# Patient Record
Sex: Female | Born: 1990 | Race: White | Hispanic: No | Smoking: Never smoker
Health system: Southern US, Community
[De-identification: ages and names within clinical notes are randomized; demographics above are authoritative.]

---

## 2013-09-16 ENCOUNTER — Inpatient Hospital Stay (HOSPITAL_COMMUNITY)
Admission: EM | Admit: 2013-09-16 | Discharge: 2013-09-20 | DRG: 956 | Disposition: A | Discharge status: Home Health | Payer: BC Managed Care – PPO | Attending: General Surgery | Admitting: General Surgery

## 2013-09-16 ENCOUNTER — Emergency Department (HOSPITAL_COMMUNITY): Payer: BC Managed Care – PPO

## 2013-09-16 ENCOUNTER — Encounter (HOSPITAL_COMMUNITY): Payer: Self-pay | Admitting: Emergency Medicine

## 2013-09-16 ENCOUNTER — Encounter (HOSPITAL_COMMUNITY): Payer: BC Managed Care – PPO | Admitting: Anesthesiology

## 2013-09-16 ENCOUNTER — Encounter (HOSPITAL_COMMUNITY): Admission: EM | Disposition: A | Discharge status: Home Health | Payer: Self-pay | Source: Home / Self Care

## 2013-09-16 ENCOUNTER — Emergency Department (HOSPITAL_COMMUNITY): Payer: BC Managed Care – PPO | Admitting: Anesthesiology

## 2013-09-16 DIAGNOSIS — S7292XA Unspecified fracture of left femur, initial encounter for closed fracture: Secondary | ICD-10-CM

## 2013-09-16 DIAGNOSIS — S27899A Unspecified injury of other specified intrathoracic organs, initial encounter: Secondary | ICD-10-CM | POA: Diagnosis present

## 2013-09-16 DIAGNOSIS — S27329A Contusion of lung, unspecified, initial encounter: Secondary | ICD-10-CM | POA: Diagnosis present

## 2013-09-16 DIAGNOSIS — S060XAA Concussion with loss of consciousness status unknown, initial encounter: Secondary | ICD-10-CM | POA: Diagnosis present

## 2013-09-16 DIAGNOSIS — M79609 Pain in unspecified limb: Secondary | ICD-10-CM | POA: Diagnosis present

## 2013-09-16 DIAGNOSIS — S72309A Unspecified fracture of shaft of unspecified femur, initial encounter for closed fracture: Principal | ICD-10-CM | POA: Diagnosis present

## 2013-09-16 DIAGNOSIS — IMO0002 Reserved for concepts with insufficient information to code with codable children: Secondary | ICD-10-CM | POA: Diagnosis present

## 2013-09-16 DIAGNOSIS — S060X9A Concussion with loss of consciousness of unspecified duration, initial encounter: Secondary | ICD-10-CM | POA: Diagnosis present

## 2013-09-16 DIAGNOSIS — T07XXXA Unspecified multiple injuries, initial encounter: Secondary | ICD-10-CM | POA: Diagnosis present

## 2013-09-16 DIAGNOSIS — D62 Acute posthemorrhagic anemia: Secondary | ICD-10-CM | POA: Diagnosis not present

## 2013-09-16 DIAGNOSIS — S0181XA Laceration without foreign body of other part of head, initial encounter: Secondary | ICD-10-CM

## 2013-09-16 DIAGNOSIS — Y92410 Unspecified street and highway as the place of occurrence of the external cause: Secondary | ICD-10-CM

## 2013-09-16 DIAGNOSIS — S27892A Contusion of other specified intrathoracic organs, initial encounter: Secondary | ICD-10-CM

## 2013-09-16 DIAGNOSIS — S27322A Contusion of lung, bilateral, initial encounter: Secondary | ICD-10-CM

## 2013-09-16 HISTORY — PX: FEMUR IM NAIL: SHX1597

## 2013-09-16 LAB — SAMPLE TO BLOOD BANK

## 2013-09-16 LAB — URINE MICROSCOPIC-ADD ON

## 2013-09-16 LAB — CBC
HEMATOCRIT: 37.3 % (ref 36.0–46.0)
HEMOGLOBIN: 12.6 g/dL (ref 12.0–15.0)
MCH: 28.3 pg (ref 26.0–34.0)
MCHC: 33.8 g/dL (ref 30.0–36.0)
MCV: 83.8 fL (ref 78.0–100.0)
Platelets: 278 10*3/uL (ref 150–400)
RBC: 4.45 MIL/uL (ref 3.87–5.11)
RDW: 12.5 % (ref 11.5–15.5)
WBC: 13.6 10*3/uL — ABNORMAL HIGH (ref 4.0–10.5)

## 2013-09-16 LAB — COMPREHENSIVE METABOLIC PANEL
ALT: 162 U/L — ABNORMAL HIGH (ref 0–35)
AST: 242 U/L — AB (ref 0–37)
Albumin: 4.1 g/dL (ref 3.5–5.2)
Alkaline Phosphatase: 40 U/L (ref 39–117)
BUN: 10 mg/dL (ref 6–23)
CALCIUM: 8.5 mg/dL (ref 8.4–10.5)
CO2: 21 mEq/L (ref 19–32)
Chloride: 102 mEq/L (ref 96–112)
Creatinine, Ser: 0.7 mg/dL (ref 0.50–1.10)
GFR calc non Af Amer: >90 mL/min (ref >90)
GLUCOSE: 128 mg/dL — AB (ref 70–99)
Potassium: 3.3 mEq/L — ABNORMAL LOW (ref 3.7–5.3)
Sodium: 141 mEq/L (ref 137–147)
TOTAL PROTEIN: 6.8 g/dL (ref 6.0–8.3)
Total Bilirubin: 0.3 mg/dL (ref 0.3–1.2)

## 2013-09-16 LAB — PROTIME-INR
INR: 1.08 (ref 0.00–1.49)
PROTHROMBIN TIME: 14 s (ref 11.6–15.2)

## 2013-09-16 LAB — ETHANOL: Alcohol, Ethyl (B): <11 mg/dL (ref 0–11)

## 2013-09-16 LAB — URINALYSIS, ROUTINE W REFLEX MICROSCOPIC
Bilirubin Urine: NEGATIVE
Glucose, UA: NEGATIVE mg/dL
Ketones, ur: NEGATIVE mg/dL
LEUKOCYTES UA: NEGATIVE
NITRITE: NEGATIVE
Protein, ur: NEGATIVE mg/dL
SPECIFIC GRAVITY, URINE: 1.005 (ref 1.005–1.030)
Urobilinogen, UA: 0.2 mg/dL (ref 0.0–1.0)
pH: 5 (ref 5.0–8.0)

## 2013-09-16 LAB — HCG, SERUM, QUALITATIVE: Preg, Serum: NEGATIVE

## 2013-09-16 LAB — PREGNANCY, URINE: Preg Test, Ur: NEGATIVE

## 2013-09-16 LAB — CDS SEROLOGY

## 2013-09-16 SURGERY — INSERTION, INTRAMEDULLARY ROD, FEMUR
Anesthesia: General | Site: Leg Upper | Laterality: Left

## 2013-09-16 MED ORDER — SUCCINYLCHOLINE CHLORIDE 20 MG/ML IJ SOLN
INTRAMUSCULAR | Status: AC
  Filled 2013-09-16: qty 1

## 2013-09-16 MED ORDER — IOHEXOL 350 MG/ML SOLN
100.0000 mL | Freq: Once | INTRAVENOUS | Status: AC | PRN
  Administered 2013-09-16: 100 mL via INTRAVENOUS

## 2013-09-16 MED ORDER — SUCCINYLCHOLINE CHLORIDE 20 MG/ML IJ SOLN
INTRAMUSCULAR | Status: DC | PRN
  Administered 2013-09-16: 130 mg via INTRAVENOUS

## 2013-09-16 MED ORDER — MORPHINE SULFATE 4 MG/ML IJ SOLN: 4.0000 mg | Freq: Once | INTRAMUSCULAR | Status: AC

## 2013-09-16 MED ORDER — GLYCOPYRROLATE 0.2 MG/ML IJ SOLN
INTRAMUSCULAR | Status: AC
  Filled 2013-09-16: qty 3

## 2013-09-16 MED ORDER — ROCURONIUM BROMIDE 50 MG/5ML IV SOLN
INTRAVENOUS | Status: AC
  Filled 2013-09-16: qty 1

## 2013-09-16 MED ORDER — ARTIFICIAL TEARS OP OINT
TOPICAL_OINTMENT | OPHTHALMIC | Status: DC | PRN
  Administered 2013-09-16: 1 via OPHTHALMIC

## 2013-09-16 MED ORDER — MIDAZOLAM HCL 2 MG/2ML IJ SOLN
INTRAMUSCULAR | Status: AC
  Filled 2013-09-16: qty 2

## 2013-09-16 MED ORDER — MORPHINE SULFATE 2 MG/ML IJ SOLN
INTRAMUSCULAR | Status: AC
  Administered 2013-09-16: 4 mg via INTRAVENOUS
  Filled 2013-09-16: qty 2

## 2013-09-16 MED ORDER — CEFAZOLIN SODIUM-DEXTROSE 2-3 GM-% IV SOLR
2.0000 g | INTRAVENOUS | Status: AC
Start: 2013-09-16 — End: 2013-09-16
  Administered 2013-09-16: 2 g via INTRAVENOUS

## 2013-09-16 MED ORDER — PROPOFOL 10 MG/ML IV BOLUS
INTRAVENOUS | Status: AC
  Filled 2013-09-16: qty 20

## 2013-09-16 MED ORDER — PROPOFOL 10 MG/ML IV BOLUS
INTRAVENOUS | Status: DC | PRN
  Administered 2013-09-16: 120 mg via INTRAVENOUS

## 2013-09-16 MED ORDER — NEOSTIGMINE METHYLSULFATE 10 MG/10ML IV SOLN
INTRAVENOUS | Status: AC
  Filled 2013-09-16: qty 1

## 2013-09-16 MED ORDER — LIDOCAINE HCL (CARDIAC) 20 MG/ML IV SOLN
INTRAVENOUS | Status: AC
  Filled 2013-09-16: qty 5

## 2013-09-16 MED ORDER — FENTANYL CITRATE 0.05 MG/ML IJ SOLN
INTRAMUSCULAR | Status: DC | PRN
Start: 2013-09-16 — End: 2013-09-17
  Administered 2013-09-16: 50 ug via INTRAVENOUS
  Administered 2013-09-16: 200 ug via INTRAVENOUS
  Administered 2013-09-17: 50 ug via INTRAVENOUS

## 2013-09-16 MED ORDER — ONDANSETRON HCL 4 MG/2ML IJ SOLN
INTRAMUSCULAR | Status: AC
  Filled 2013-09-16: qty 2

## 2013-09-16 MED ORDER — LIDOCAINE HCL (CARDIAC) 20 MG/ML IV SOLN
INTRAVENOUS | Status: DC | PRN
  Administered 2013-09-16: 30 mg via INTRAVENOUS

## 2013-09-16 MED ORDER — MIDAZOLAM HCL 5 MG/5ML IJ SOLN
INTRAMUSCULAR | Status: DC | PRN
Start: 2013-09-16 — End: 2013-09-17
  Administered 2013-09-16: 1 mg via INTRAVENOUS

## 2013-09-16 MED ORDER — FENTANYL CITRATE 0.05 MG/ML IJ SOLN
INTRAMUSCULAR | Status: AC
  Filled 2013-09-16: qty 5

## 2013-09-16 SURGICAL SUPPLY — 53 items
BIT DRILL LONG 4.0MM (BIT) ×2 IMPLANT
BIT DRILL SHORT 4.0 (BIT) ×1 IMPLANT
BNDG COHESIVE 4X5 TAN NS LF (GAUZE/BANDAGES/DRESSINGS) ×3 IMPLANT
CONT SPEC 4OZ CLIKSEAL STRL BL (MISCELLANEOUS) ×3 IMPLANT
COVER PERINEAL POST (MISCELLANEOUS) ×3 IMPLANT
COVER SURGICAL LIGHT HANDLE (MISCELLANEOUS) ×3 IMPLANT
DRAPE INCISE IOBAN 66X45 STRL (DRAPES) ×3 IMPLANT
DRAPE STERI IOBAN 125X83 (DRAPES) ×3 IMPLANT
DRILL BIT LONG 4.0MM (BIT) ×6
DRILL BIT SHORT 4.0 (BIT) ×2
DRSG MEPILEX BORDER 4X4 (GAUZE/BANDAGES/DRESSINGS) ×3 IMPLANT
DRSG MEPILEX BORDER 4X8 (GAUZE/BANDAGES/DRESSINGS) ×3 IMPLANT
DRSG PAD ABDOMINAL 8X10 ST (GAUZE/BANDAGES/DRESSINGS) ×6 IMPLANT
DRSG TEGADERM 4X4.75 (GAUZE/BANDAGES/DRESSINGS) ×3 IMPLANT
DURAPREP 26ML APPLICATOR (WOUND CARE) ×3 IMPLANT
ELECT REM PT RETURN 9FT ADLT (ELECTROSURGICAL) ×3
ELECTRODE REM PT RTRN 9FT ADLT (ELECTROSURGICAL) ×1 IMPLANT
FACESHIELD WRAPAROUND (MASK) ×3 IMPLANT
GAUZE XEROFORM 5X9 LF (GAUZE/BANDAGES/DRESSINGS) ×3 IMPLANT
GLOVE BIO SURGEON STRL SZ8 (GLOVE) ×3 IMPLANT
GLOVE BIOGEL PI IND STRL 8 (GLOVE) ×2 IMPLANT
GLOVE BIOGEL PI INDICATOR 8 (GLOVE) ×4
GLOVE ORTHO TXT STRL SZ7.5 (GLOVE) ×3 IMPLANT
GOWN STRL REUS W/ TWL LRG LVL3 (GOWN DISPOSABLE) ×1 IMPLANT
GOWN STRL REUS W/ TWL XL LVL3 (GOWN DISPOSABLE) ×2 IMPLANT
GOWN STRL REUS W/TWL LRG LVL3 (GOWN DISPOSABLE) ×2
GOWN STRL REUS W/TWL XL LVL3 (GOWN DISPOSABLE) ×4
GUIDE PIN 3.2MM (MISCELLANEOUS) ×2
GUIDE PIN ORTH 343X3.2XBRAD (MISCELLANEOUS) ×1 IMPLANT
GUIDE ROD 3.0 (MISCELLANEOUS) ×3
KIT BASIN OR (CUSTOM PROCEDURE TRAY) ×3 IMPLANT
KIT ROOM TURNOVER OR (KITS) ×3 IMPLANT
LINER BOOT UNIVERSAL DISP (MISCELLANEOUS) IMPLANT
MANIFOLD NEPTUNE II (INSTRUMENTS) IMPLANT
NAIL TAN LT LIME 10X36 (Nail) ×3 IMPLANT
NS IRRIG 1000ML POUR BTL (IV SOLUTION) ×3 IMPLANT
PACK GENERAL/GYN (CUSTOM PROCEDURE TRAY) ×3 IMPLANT
PAD ARMBOARD 7.5X6 YLW CONV (MISCELLANEOUS) ×3 IMPLANT
PAD CAST 4YDX4 CTTN HI CHSV (CAST SUPPLIES) ×2 IMPLANT
PADDING CAST COTTON 4X4 STRL (CAST SUPPLIES) ×4
ROD GUIDE 3.0 (MISCELLANEOUS) ×1 IMPLANT
SCREW TRIGEN LOW PROF 5.0X37.5 (Screw) ×3 IMPLANT
SCREW TRIGEN LOW PROF 5.0X65 (Screw) ×3 IMPLANT
SPONGE GAUZE 4X4 12PLY STER LF (GAUZE/BANDAGES/DRESSINGS) ×3 IMPLANT
STAPLER VISISTAT 35W (STAPLE) ×3 IMPLANT
SUT ETHILON 4 0 PS 2 18 (SUTURE) ×3 IMPLANT
SUT VIC AB 0 CT1 27 (SUTURE) ×2
SUT VIC AB 0 CT1 27XBRD ANBCTR (SUTURE) ×1 IMPLANT
SUT VIC AB 2-0 CT1 27 (SUTURE) ×2
SUT VIC AB 2-0 CT1 TAPERPNT 27 (SUTURE) ×1 IMPLANT
TOWEL OR 17X24 6PK STRL BLUE (TOWEL DISPOSABLE) ×3 IMPLANT
TOWEL OR 17X26 10 PK STRL BLUE (TOWEL DISPOSABLE) ×3 IMPLANT
WATER STERILE IRR 1000ML POUR (IV SOLUTION) IMPLANT
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
## 2013-09-16 NOTE — Anesthesia Preprocedure Evaluation (Addendum)
Anesthesia Evaluation  Patient identified by MRN, date of birth, ID band Patient awake    Reviewed: Allergy & Precautions, H&P , NPO status , Patient's Chart, lab work & pertinent test results  History of Anesthesia Complications Negative for: history of anesthetic complications  Airway Mallampati: I TM Distance: >3 FB Neck ROM: Full    Dental  (+) Teeth Intact, Dental Advisory Given   Pulmonary  Small pulmonary contusion Small mediastinal hematoma breath sounds clear to auscultation  Pulmonary exam normal       Cardiovascular negative cardio ROS  Rhythm:Regular Rate:Normal     Neuro/Psych  C-spine not cleared negative neurological ROS     GI/Hepatic negative GI ROS, Elevated LFTs: abd scan clear, trauma MD to follow   Endo/Other  negative endocrine ROS  Renal/GU negative Renal ROS     Musculoskeletal   Abdominal   Peds  Hematology negative hematology ROS (+)   Anesthesia Other Findings   Reproductive/Obstetrics 09/16/13 preg test: NEG                          Anesthesia Physical Anesthesia Plan  ASA: II and emergent  Anesthesia Plan: General   Post-op Pain Management:    Induction: Intravenous and Rapid sequence  Airway Management Planned: Oral ETT  Additional Equipment:   Intra-op Plan:   Post-operative Plan: Extubation in OR  Informed Consent: I have reviewed the patients History and Physical, chart, labs and discussed the procedure including the risks, benefits and alternatives for the proposed anesthesia with the patient or authorized representative who has indicated his/her understanding and acceptance.   Dental advisory given  Plan Discussed with: CRNA and Surgeon  Anesthesia Plan Comments: (Plan routine monitors, GETA)        Anesthesia Quick Evaluation
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
## 2013-09-17 ENCOUNTER — Emergency Department (HOSPITAL_COMMUNITY): Payer: BC Managed Care – PPO

## 2013-09-17 ENCOUNTER — Encounter (HOSPITAL_COMMUNITY): Payer: Self-pay | Admitting: General Surgery

## 2013-09-17 DIAGNOSIS — S27329A Contusion of lung, unspecified, initial encounter: Secondary | ICD-10-CM

## 2013-09-17 DIAGNOSIS — S7290XA Unspecified fracture of unspecified femur, initial encounter for closed fracture: Secondary | ICD-10-CM

## 2013-09-17 LAB — COMPREHENSIVE METABOLIC PANEL
ALBUMIN: 3.1 g/dL — AB (ref 3.5–5.2)
ALK PHOS: 25 U/L — AB (ref 39–117)
ALT: 125 U/L — AB (ref 0–35)
AST: 166 U/L — AB (ref 0–37)
BUN: 8 mg/dL (ref 6–23)
CALCIUM: 7.8 mg/dL — AB (ref 8.4–10.5)
CO2: 17 mEq/L — ABNORMAL LOW (ref 19–32)
Chloride: 104 mEq/L (ref 96–112)
Creatinine, Ser: 0.53 mg/dL (ref 0.50–1.10)
GFR calc Af Amer: >90 mL/min (ref >90)
GFR calc non Af Amer: >90 mL/min (ref >90)
GLUCOSE: 143 mg/dL — AB (ref 70–99)
POTASSIUM: 3.9 meq/L (ref 3.7–5.3)
SODIUM: 137 meq/L (ref 137–147)
Total Bilirubin: 0.4 mg/dL (ref 0.3–1.2)
Total Protein: 5.3 g/dL — ABNORMAL LOW (ref 6.0–8.3)

## 2013-09-17 LAB — CBC
HEMATOCRIT: 27.6 % — AB (ref 36.0–46.0)
HEMOGLOBIN: 9.7 g/dL — AB (ref 12.0–15.0)
MCH: 29.5 pg (ref 26.0–34.0)
MCHC: 35.1 g/dL (ref 30.0–36.0)
MCV: 83.9 fL (ref 78.0–100.0)
Platelets: 149 10*3/uL — ABNORMAL LOW (ref 150–400)
RBC: 3.29 MIL/uL — ABNORMAL LOW (ref 3.87–5.11)
RDW: 12.4 % (ref 11.5–15.5)
WBC: 10.8 10*3/uL — ABNORMAL HIGH (ref 4.0–10.5)

## 2013-09-17 MED ORDER — MEPERIDINE HCL 25 MG/ML IJ SOLN
6.2500 mg | INTRAMUSCULAR | Status: DC | PRN
  Administered 2013-09-17: 12.5 mg via INTRAVENOUS

## 2013-09-17 MED ORDER — HYDROCODONE-ACETAMINOPHEN 5-325 MG PO TABS
1.0000 | ORAL_TABLET | ORAL | Status: DC | PRN
  Administered 2013-09-17 (×2): 2 via ORAL
  Filled 2013-09-17 (×2): qty 2

## 2013-09-17 MED ORDER — NEOSTIGMINE METHYLSULFATE 10 MG/10ML IV SOLN
INTRAVENOUS | Status: DC | PRN
  Administered 2013-09-17: 4 mg via INTRAVENOUS

## 2013-09-17 MED ORDER — MEPERIDINE HCL 25 MG/ML IJ SOLN
INTRAMUSCULAR | Status: AC
  Filled 2013-09-17: qty 1

## 2013-09-17 MED ORDER — FENTANYL CITRATE 0.05 MG/ML IJ SOLN
INTRAMUSCULAR | Status: AC
  Filled 2013-09-17: qty 5

## 2013-09-17 MED ORDER — OXYCODONE HCL 5 MG PO TABS
5.0000 mg | ORAL_TABLET | ORAL | Status: DC | PRN
  Administered 2013-09-18: 10 mg via ORAL
  Filled 2013-09-17: qty 2

## 2013-09-17 MED ORDER — ONDANSETRON HCL 4 MG/2ML IJ SOLN
INTRAMUSCULAR | Status: DC | PRN
  Administered 2013-09-17: 4 mg via INTRAVENOUS

## 2013-09-17 MED ORDER — GLYCOPYRROLATE 0.2 MG/ML IJ SOLN
INTRAMUSCULAR | Status: DC | PRN
  Administered 2013-09-17: .6 mg via INTRAVENOUS

## 2013-09-17 MED ORDER — METHOCARBAMOL 1000 MG/10ML IJ SOLN: 500.0000 mg | Freq: Four times a day (QID) | INTRAVENOUS | Status: DC | PRN

## 2013-09-17 MED ORDER — HYDROMORPHONE HCL PF 1 MG/ML IJ SOLN
INTRAMUSCULAR | Status: AC
  Filled 2013-09-17: qty 1

## 2013-09-17 MED ORDER — ONDANSETRON HCL 4 MG/2ML IJ SOLN
4.0000 mg | Freq: Four times a day (QID) | INTRAMUSCULAR | Status: DC | PRN
  Administered 2013-09-17 – 2013-09-18 (×2): 4 mg via INTRAVENOUS
  Filled 2013-09-17 (×2): qty 2

## 2013-09-17 MED ORDER — MIDAZOLAM HCL 2 MG/2ML IJ SOLN: 0.5000 mg | Freq: Once | INTRAMUSCULAR | Status: DC | PRN

## 2013-09-17 MED ORDER — METHOCARBAMOL 500 MG PO TABS
500.0000 mg | ORAL_TABLET | Freq: Four times a day (QID) | ORAL | Status: DC | PRN
  Administered 2013-09-17 – 2013-09-18 (×2): 500 mg via ORAL
  Filled 2013-09-17 (×2): qty 1

## 2013-09-17 MED ORDER — METOCLOPRAMIDE HCL 5 MG/ML IJ SOLN: 5.0000 mg | Freq: Three times a day (TID) | INTRAMUSCULAR | Status: DC | PRN

## 2013-09-17 MED ORDER — ENOXAPARIN SODIUM 40 MG/0.4ML ~~LOC~~ SOLN
40.0000 mg | SUBCUTANEOUS | Status: DC
  Filled 2013-09-17: qty 0.4

## 2013-09-17 MED ORDER — DOCUSATE SODIUM 100 MG PO CAPS
100.0000 mg | ORAL_CAPSULE | Freq: Two times a day (BID) | ORAL | Status: DC
  Administered 2013-09-17 – 2013-09-20 (×7): 100 mg via ORAL
  Filled 2013-09-17 (×12): qty 1

## 2013-09-17 MED ORDER — PANTOPRAZOLE SODIUM 40 MG PO TBEC
40.0000 mg | DELAYED_RELEASE_TABLET | Freq: Every day | ORAL | Status: DC
  Administered 2013-09-17 – 2013-09-18 (×2): 40 mg via ORAL
  Filled 2013-09-17 (×2): qty 1

## 2013-09-17 MED ORDER — DIPHENHYDRAMINE HCL 12.5 MG/5ML PO ELIX: 12.5000 mg | ORAL_SOLUTION | ORAL | Status: DC | PRN

## 2013-09-17 MED ORDER — 0.9 % SODIUM CHLORIDE (POUR BTL) OPTIME
TOPICAL | Status: DC | PRN
  Administered 2013-09-16: 1000 mL

## 2013-09-17 MED ORDER — OXYCODONE HCL 5 MG PO TABS: 5.0000 mg | ORAL_TABLET | Freq: Once | ORAL | Status: DC | PRN

## 2013-09-17 MED ORDER — CEFAZOLIN SODIUM 1-5 GM-% IV SOLN
1.0000 g | Freq: Four times a day (QID) | INTRAVENOUS | Status: AC
  Administered 2013-09-17 (×3): 1 g via INTRAVENOUS
  Filled 2013-09-17 (×4): qty 50

## 2013-09-17 MED ORDER — ONDANSETRON HCL 4 MG PO TABS: 4.0000 mg | ORAL_TABLET | Freq: Four times a day (QID) | ORAL | Status: DC | PRN

## 2013-09-17 MED ORDER — HYDROMORPHONE HCL PF 1 MG/ML IJ SOLN
0.2500 mg | INTRAMUSCULAR | Status: DC | PRN
  Administered 2013-09-17 (×4): 0.5 mg via INTRAVENOUS

## 2013-09-17 MED ORDER — LACTATED RINGERS IV SOLN
INTRAVENOUS | Status: DC | PRN
  Administered 2013-09-16 – 2013-09-17 (×2): via INTRAVENOUS

## 2013-09-17 MED ORDER — PROMETHAZINE HCL 25 MG/ML IJ SOLN: 6.2500 mg | INTRAMUSCULAR | Status: DC | PRN

## 2013-09-17 MED ORDER — METOCLOPRAMIDE HCL 5 MG PO TABS
5.0000 mg | ORAL_TABLET | Freq: Three times a day (TID) | ORAL | Status: DC | PRN
Start: 2013-09-17 — End: 2013-09-18
  Filled 2013-09-17: qty 2

## 2013-09-17 MED ORDER — MORPHINE SULFATE 2 MG/ML IJ SOLN
1.0000 mg | INTRAMUSCULAR | Status: DC | PRN
  Administered 2013-09-17 (×5): 2 mg via INTRAVENOUS
  Administered 2013-09-17 – 2013-09-18 (×2): 4 mg via INTRAVENOUS
  Filled 2013-09-17: qty 2
  Filled 2013-09-17 (×2): qty 1
  Filled 2013-09-17: qty 2
  Filled 2013-09-17 (×3): qty 1

## 2013-09-17 MED ORDER — SODIUM CHLORIDE 0.9 % IV SOLN
INTRAVENOUS | Status: DC
  Administered 2013-09-17 (×2): via INTRAVENOUS

## 2013-09-17 MED ORDER — OXYCODONE HCL 5 MG/5ML PO SOLN: 5.0000 mg | Freq: Once | ORAL | Status: DC | PRN

## 2013-09-17 MED ORDER — PHENYLEPHRINE HCL 10 MG/ML IJ SOLN
10.0000 mg | INTRAVENOUS | Status: DC | PRN
  Administered 2013-09-16: 10 ug/min via INTRAVENOUS

## 2013-09-17 MED ORDER — ROCURONIUM BROMIDE 100 MG/10ML IV SOLN
INTRAVENOUS | Status: DC | PRN
  Administered 2013-09-16: 30 mg via INTRAVENOUS
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
## 2013-09-17 NOTE — Addendum Note (Signed)
Addendum created 09/17/13 0349 by Edmonia CaprioAmanda M Auston, CRNA   Modules edited: Charges VN
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
## 2013-09-17 NOTE — Brief Op Note (Signed)
09/16/2013 - 09/17/2013  1:00 AM  PATIENT:  Katelyn Hess  22 y.o. female  PRE-OPERATIVE DIAGNOSIS:  left femur fracture  POST-OPERATIVE DIAGNOSIS:  left femur fracture  PROCEDURE:  Procedure(s): INTRAMEDULLARY (IM) NAIL FEMORAL (Left)  SURGEON:  Surgeon(s) and Role:    * Kathryne Hitchhristopher Y Blackman, MD - Primary  PHYSICIAN ASSISTANT: gil clark, PA-C  ANESTHESIA:   general  EBL:  Total I/O In: 1000 [I.V.:1000] Out: 1130 [Urine:930; Blood:200]  BLOOD ADMINISTERED:none  DRAINS: none   LOCAL MEDICATIONS USED:  NONE  SPECIMEN:  No Specimen  DISPOSITION OF SPECIMEN:  N/A  COUNTS:  YES  TOURNIQUET:  * No tourniquets in log *  DICTATION: .Other Dictation: Dictation Number [id number]  PLAN OF CARE: Admit to inpatient   PATIENT DISPOSITION:  PACU - hemodynamically stable.   Delay start of Pharmacological VTE agent (>24hrs) due to surgical blood loss or risk of bleeding: no

## 2013-09-17 NOTE — Addendum Note (Signed)
Addendum created 09/17/13 0228 by Edmonia CaprioAmanda M Auston, CRNA   Modules edited: Anesthesia Medication Administration
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
## 2013-09-17 NOTE — Anesthesia Postprocedure Evaluation (Signed)
  Anesthesia Post-op Note  Patient: Katelyn Hess  Procedure(s) Performed: Procedure(s): INTRAMEDULLARY (IM) NAIL FEMORAL (Left)  Patient Location: PACU  Anesthesia Type:General  Level of Consciousness: awake, alert , oriented and patient cooperative  Airway and Oxygen Therapy: Patient Spontanous Breathing and Patient connected to nasal cannula oxygen  Post-op Pain: 4 /10, mild  Post-op Assessment: Post-op Vital signs reviewed, Patient's Cardiovascular Status Stable, Respiratory Function Stable, Patent Airway, No signs of Nausea or vomiting and Pain level controlled  Post-op Vital Signs: Reviewed and stable  Last Vitals:  Filed Vitals:   09/17/13 0214  BP: 109/68  Pulse:   Temp:   Resp: 19    Complications: No apparent anesthesia complications

## 2013-09-17 NOTE — Progress Notes (Signed)
PT Cancellation Note  Patient Details Name: Katelyn Hess MRN: [id number] DOB: 11/04/1990   Cancelled Treatment:    Reason Eval/Treat Not Completed: Patient not medically ready  Orders for PT evaluation to start tomorrow (09/18/13). Will follow up with patient tomorrow.  [address]Logan Secor MindoroBarbour, [place] CarolinaPT [phone]  [place] Mount[place]bour, Logan S 09/17/2013, 8:35 AM

## 2013-09-17 NOTE — Transfer of Care (Signed)
Immediate Anesthesia Transfer of Care Note  Patient: Katelyn Hess  Procedure(s) Performed: Procedure(s): INTRAMEDULLARY (IM) NAIL FEMORAL (Left)  Patient Location: PACU  Anesthesia Type:General  Level of Consciousness: awake and alert   Airway & Oxygen Therapy: Patient Spontanous Breathing and Patient connected to nasal cannula oxygen  Post-op Assessment: Report given to PACU RN and Post -op Vital signs reviewed and stable  Post vital signs: Reviewed and stable  Complications: No apparent anesthesia complications

## 2013-09-18 ENCOUNTER — Inpatient Hospital Stay (HOSPITAL_COMMUNITY): Payer: BC Managed Care – PPO

## 2013-09-18 DIAGNOSIS — D62 Acute posthemorrhagic anemia: Secondary | ICD-10-CM

## 2013-09-18 DIAGNOSIS — S27322A Contusion of lung, bilateral, initial encounter: Secondary | ICD-10-CM

## 2013-09-18 DIAGNOSIS — S060X9A Concussion with loss of consciousness of unspecified duration, initial encounter: Secondary | ICD-10-CM

## 2013-09-18 DIAGNOSIS — S27892A Contusion of other specified intrathoracic organs, initial encounter: Secondary | ICD-10-CM

## 2013-09-18 DIAGNOSIS — S060XAA Concussion with loss of consciousness status unknown, initial encounter: Secondary | ICD-10-CM

## 2013-09-18 LAB — BASIC METABOLIC PANEL
BUN: 4 mg/dL — ABNORMAL LOW (ref 6–23)
CO2: 25 meq/L (ref 19–32)
CREATININE: 0.56 mg/dL (ref 0.50–1.10)
Calcium: 7.7 mg/dL — ABNORMAL LOW (ref 8.4–10.5)
Chloride: 107 mEq/L (ref 96–112)
GFR calc Af Amer: >90 mL/min (ref >90)
Glucose, Bld: 105 mg/dL — ABNORMAL HIGH (ref 70–99)
Potassium: 3.5 mEq/L — ABNORMAL LOW (ref 3.7–5.3)
Sodium: 142 mEq/L (ref 137–147)

## 2013-09-18 LAB — CBC
HCT: 22.2 % — ABNORMAL LOW (ref 36.0–46.0)
HCT: 22.4 % — ABNORMAL LOW (ref 36.0–46.0)
HEMOGLOBIN: 7.7 g/dL — AB (ref 12.0–15.0)
Hemoglobin: 7.7 g/dL — ABNORMAL LOW (ref 12.0–15.0)
MCH: 29.1 pg (ref 26.0–34.0)
MCH: 28.9 pg (ref 26.0–34.0)
MCHC: 34.7 g/dL (ref 30.0–36.0)
MCHC: 34.4 g/dL (ref 30.0–36.0)
MCV: 83.8 fL (ref 78.0–100.0)
MCV: 84.2 fL (ref 78.0–100.0)
Platelets: 119 10*3/uL — ABNORMAL LOW (ref 150–400)
Platelets: 162 10*3/uL (ref 150–400)
RBC: 2.65 MIL/uL — AB (ref 3.87–5.11)
RBC: 2.66 MIL/uL — ABNORMAL LOW (ref 3.87–5.11)
RDW: 12.7 % (ref 11.5–15.5)
RDW: 12.6 % (ref 11.5–15.5)
WBC: 6.2 10*3/uL (ref 4.0–10.5)
WBC: 7.4 10*3/uL (ref 4.0–10.5)

## 2013-09-18 MED ORDER — MORPHINE SULFATE 2 MG/ML IJ SOLN: 2.0000 mg | INTRAMUSCULAR | Status: DC | PRN

## 2013-09-18 MED ORDER — ADULT MULTIVITAMIN W/MINERALS CH
1.0000 | ORAL_TABLET | Freq: Every day | ORAL | Status: DC
  Administered 2013-09-18 – 2013-09-20 (×3): 1 via ORAL
  Filled 2013-09-18 (×3): qty 1

## 2013-09-18 MED ORDER — OXYCODONE HCL 5 MG PO TABS
5.0000 mg | ORAL_TABLET | ORAL | Status: DC | PRN
  Administered 2013-09-18 (×3): 10 mg via ORAL
  Administered 2013-09-19: 5 mg via ORAL
  Administered 2013-09-19: 15 mg via ORAL
  Administered 2013-09-19 (×2): 10 mg via ORAL
  Administered 2013-09-19 – 2013-09-20 (×3): 15 mg via ORAL
  Filled 2013-09-18 (×2): qty 2
  Filled 2013-09-18 (×2): qty 3
  Filled 2013-09-18 (×2): qty 2
  Filled 2013-09-18: qty 1
  Filled 2013-09-18: qty 3
  Filled 2013-09-18: qty 2
  Filled 2013-09-18: qty 3

## 2013-09-18 MED ORDER — POLYETHYLENE GLYCOL 3350 17 G PO PACK
17.0000 g | PACK | Freq: Every day | ORAL | Status: DC
  Administered 2013-09-18 – 2013-09-19 (×2): 17 g via ORAL
  Filled 2013-09-18 (×4): qty 1

## 2013-09-18 MED ORDER — FERROUS SULFATE 325 (65 FE) MG PO TABS
325.0000 mg | ORAL_TABLET | Freq: Two times a day (BID) | ORAL | Status: DC
  Administered 2013-09-18 – 2013-09-20 (×4): 325 mg via ORAL
  Filled 2013-09-18 (×6): qty 1

## 2013-09-18 NOTE — H&P (Signed)
I saw the patient, participated in the history, exam and medical decision making, and concur with the physician assistant's note above.  Aarohi Redditt M. Noma Quijas, MD, FACS General, Bariatric, & Minimally Invasive Surgery [facility]   
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
## 2013-09-18 NOTE — Progress Notes (Signed)
Will watch hemoglobin.  Patient working with PT.  This patient has been seen and I agree with the findings and treatment plan.  Marta LamasJames O. Gae BonWyatt, III, MD, FACS [phone] (pager) [phone] (direct pager) Trauma Surgeon
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
## 2013-09-18 NOTE — Progress Notes (Signed)
Hr 140 with PT working with patient. Asymptomatic. Monitor.

## 2013-09-19 ENCOUNTER — Encounter (HOSPITAL_COMMUNITY): Payer: Self-pay | Admitting: Orthopaedic Surgery

## 2013-09-19 DIAGNOSIS — S72309A Unspecified fracture of shaft of unspecified femur, initial encounter for closed fracture: Secondary | ICD-10-CM

## 2013-09-19 MED ORDER — ENOXAPARIN SODIUM 40 MG/0.4ML ~~LOC~~ SOLN
40.0000 mg | Freq: Every day | SUBCUTANEOUS | Status: DC
  Administered 2013-09-19 – 2013-09-20 (×2): 40 mg via SUBCUTANEOUS
  Filled 2013-09-19 (×2): qty 0.4
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
## 2013-09-19 NOTE — Progress Notes (Signed)
Patient doing better.  Still mildly tachycardic.  CPM  This patient has been seen and I agree with the findings and treatment plan.  Marta LamasJames O. Gae BonWyatt, III, MD, FACS [phone] (pager) [phone] (direct pager) Trauma Surgeon

## 2013-09-19 NOTE — Progress Notes (Signed)
UR completed.  Michelle Bryson, RN BSN MHA CCM Trauma/Neuro ICU Case Manager [phone]  
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
## 2013-09-19 NOTE — Progress Notes (Signed)
OT Cancellation Note  Patient Details Name: Katelyn Hess MRN: [id number] DOB: 11/25/1990   Cancelled Treatment:    Reason Eval/Treat Not Completed: Other (comment) (Pt off unit with visiting privileges.) OT to reattempt.  Pilar GrammesMathews, Kathryn H 09/19/2013, 2:44 PM

## 2013-09-20 DIAGNOSIS — T07XXXA Unspecified multiple injuries, initial encounter: Secondary | ICD-10-CM | POA: Diagnosis present

## 2013-09-20 DIAGNOSIS — S0181XA Laceration without foreign body of other part of head, initial encounter: Secondary | ICD-10-CM

## 2013-09-20 MED ORDER — OXYCODONE-ACETAMINOPHEN 7.5-325 MG PO TABS: 1.0000 | ORAL_TABLET | ORAL | Status: AC | PRN

## 2013-09-20 MED ORDER — WHITE PETROLATUM GEL
Status: AC
  Administered 2013-09-20: 1
  Filled 2013-09-20: qty 5
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
## 2013-09-20 NOTE — Progress Notes (Signed)
D/C instructions and scripts given. Pt verbalized understanding of home care. Pts family at the bedside to transport Pt home.
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
## 2013-09-20 NOTE — Progress Notes (Signed)
Rehab admissions - Please see rehab consult done by Dr. Wynn BankerKirsteins [facility]  Call me for questions.  #[phone]

## 2013-09-20 NOTE — Discharge Instructions (Signed)
You can get your incisions wet in the shower daily. Place new dry dressings or band-aids over your left leg incisions daily and a very small amount of neosporin daily.  Wash wounds daily in shower with soap and water. Do not soak. Apply antibiotic ointment (e.g. Neosporin) twice daily and as needed to keep moist. Cover with dry dressing.  No driving while taking oxycodone.

## 2013-09-20 NOTE — Evaluation (Signed)
Agree with note. [id number] 1EV 09/20/2013 Martie RoundJulie Vannie Hilgert, OTR/L Pager: [phone]

## 2013-09-20 NOTE — Progress Notes (Signed)
Okay to go home.  This patient has been seen and I agree with the findings and treatment plan.  Cannen Dupras O. Chenise Mulvihill, III, MD, FACS [phone] (pager) [phone] (direct pager) Trauma Surgeon 

## 2013-09-20 NOTE — Progress Notes (Addendum)
[facility]  Phone number to use is: [phone] and that belongs to pt's father.  [facility]  The [facility] for the patient's [place] [facility].  That phone number is [phone]. Fax is [phone].  DME will deliver from [employer] equipment to patient's room.
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
## 2013-09-21 ENCOUNTER — Telehealth (HOSPITAL_COMMUNITY): Payer: Self-pay

## 2013-09-21 NOTE — Telephone Encounter (Signed)
Directed mother to call medical records for copy of d/c summary.

## 2013-09-28 ENCOUNTER — Encounter (HOSPITAL_COMMUNITY): Payer: Self-pay | Admitting: Orthopaedic Surgery

## 2013-09-28 NOTE — OR Nursing (Signed)
Late entry on 09-28-2013 by D. Khalani Novoa, RN to add surgery end time. 
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
## 2019-06-12 ENCOUNTER — Telehealth: Payer: Self-pay | Admitting: Orthopaedic Surgery

## 2019-06-12 NOTE — Telephone Encounter (Signed)
Patient called and requesting Dr. Magnus Ivan give a call back. Patient states Dr. Magnus Ivan preformed her surgery years ago and has questions. Patient phone number is [phone].
